# Patient Record
Sex: Male | Born: 1990 | Race: White | Hispanic: No
Health system: Southern US, Community
[De-identification: ages and names within clinical notes are randomized; demographics above are authoritative.]

## PROBLEM LIST (undated history)

## (undated) DIAGNOSIS — E079 Disorder of thyroid, unspecified: Secondary | ICD-10-CM

## (undated) DIAGNOSIS — F419 Anxiety disorder, unspecified: Secondary | ICD-10-CM

## (undated) DIAGNOSIS — R519 Headache, unspecified: Secondary | ICD-10-CM

## (undated) DIAGNOSIS — Z9889 Other specified postprocedural states: Secondary | ICD-10-CM

## (undated) DIAGNOSIS — E785 Hyperlipidemia, unspecified: Secondary | ICD-10-CM

## (undated) DIAGNOSIS — F4310 Post-traumatic stress disorder, unspecified: Secondary | ICD-10-CM

## (undated) DIAGNOSIS — F32A Depression, unspecified: Secondary | ICD-10-CM

## (undated) DIAGNOSIS — R112 Nausea with vomiting, unspecified: Secondary | ICD-10-CM

## (undated) HISTORY — PX: MASTECTOMY: SHX3

## (undated) HISTORY — PX: ABDOMINAL HYSTERECTOMY: SHX81

---

## 2018-09-09 DIAGNOSIS — Z87890 Personal history of sex reassignment: Secondary | ICD-10-CM | POA: Insufficient documentation

## 2020-08-22 ENCOUNTER — Ambulatory Visit (HOSPITAL_COMMUNITY)
Admission: EM | Admit: 2020-08-22 | Discharge: 2020-08-22 | Disposition: A | Discharge status: Home / Self Care | Payer: No Typology Code available for payment source

## 2020-08-22 ENCOUNTER — Encounter (HOSPITAL_COMMUNITY): Payer: Self-pay

## 2020-08-22 ENCOUNTER — Ambulatory Visit (INDEPENDENT_AMBULATORY_CARE_PROVIDER_SITE_OTHER): Payer: No Typology Code available for payment source

## 2020-08-22 ENCOUNTER — Other Ambulatory Visit: Payer: Self-pay

## 2020-08-22 DIAGNOSIS — Y990 Civilian activity done for income or pay: Secondary | ICD-10-CM

## 2020-08-22 DIAGNOSIS — M79642 Pain in left hand: Secondary | ICD-10-CM | POA: Diagnosis not present

## 2020-08-22 DIAGNOSIS — S6992XA Unspecified injury of left wrist, hand and finger(s), initial encounter: Secondary | ICD-10-CM

## 2020-08-22 DIAGNOSIS — X58XXXA Exposure to other specified factors, initial encounter: Secondary | ICD-10-CM | POA: Diagnosis not present

## 2020-08-22 HISTORY — DX: Anxiety disorder, unspecified: F41.9

## 2020-08-22 HISTORY — DX: Disorder of thyroid, unspecified: E07.9

## 2020-08-22 HISTORY — DX: Post-traumatic stress disorder, unspecified: F43.10

## 2020-08-22 HISTORY — DX: Depression, unspecified: F32.A

## 2020-08-22 MED ORDER — NAPROXEN 500 MG PO TABS: 500.0000 mg | ORAL_TABLET | Freq: Two times a day (BID) | ORAL | 0 refills | Status: DC

## 2020-08-22 NOTE — ED Triage Notes (Signed)
Pt presents with left hand pain after smashing it between a prop and door frame at work this evening.

## 2020-08-22 NOTE — ED Provider Notes (Signed)
[facility]    CSN: [id number] Arrival date & time: 08/22/20  1745      History   Chief Complaint Chief Complaint  Patient presents with  . Hand Injury    HPI Gary Calderon is a 30 y.o. male.   HPI   Hand Pain: Patient reports that he smashed his left hand in a door frame accidentally this evening at work.  He notes pain of the hand itself as well as of the second through fourth fingers.  He states that he has pain with flexion and extension of the third and fourth fingers of the side.  He also has a area of ecchymosis throughout the hand from this encounter.  He has tried ibuprofen which has not given much relief to symptoms.  He denies any previous injury to this area.   Past Medical History:  Diagnosis Date  . Anxiety   . Depression   . PTSD (post-traumatic stress disorder)   . Thyroid disease     There are no problems to display for this patient.   Past Surgical History:  Procedure Laterality Date  . ABDOMINAL HYSTERECTOMY    . MASTECTOMY         Home Medications    Prior to Admission medications   Medication Sig Start Date End Date Taking? Authorizing Provider  busPIRone (BUSPAR) 10 MG tablet Take 10 mg by mouth 3 (three) times daily.   Yes [provider]    Family History Family History  Family history unknown: Yes    Social History Social History   Tobacco Use  . Smoking status: Never Smoker     Allergies   Caffeine and Mustard seed   Review of Systems Review of Systems  As stated above in HPI Physical Exam Triage Vital Signs ED Triage Vitals  Enc Vitals Group     BP 08/22/20 1847 131/84     Pulse Rate 08/22/20 1847 90     Resp 08/22/20 1847 20     Temp 08/22/20 1847 98 F (36.7 C)     Temp Source 08/22/20 1847 Oral     SpO2 08/22/20 1847 98 %     Weight --      Height --      Head Circumference --      Peak Flow --      Pain Score 08/22/20 1849 7     Pain Loc --      Pain Edu? --      Excl. in GC?  --    No data found.  Updated Vital Signs BP 131/84 (BP Location: Right Arm)   Pulse 90   Temp 98 F (36.7 C) (Oral)   Resp 20   SpO2 98%   Physical Exam Vitals and nursing note reviewed.  Constitutional:      Appearance: Normal appearance.  Musculoskeletal:     Comments: Positive fracture testing to the left hand.  There is some tenderness to palpation of the proximal fourth finger, the MCP joint of the third finger and the proximal aspect of the second digit. Patient is unable to fully extend fingers 3 and 4 of the left hand.  Skin:    Capillary Refill: Capillary refill takes less than 2 seconds.     Findings: Bruising (linear erythematic early ecchymosis of the left anterior hand) present.  Neurological:     Mental Status: He is alert and oriented to person, place, and time.     Sensory: No sensory deficit.  Motor: No weakness.      UC Treatments / Results  Labs (all labs ordered are listed, but only abnormal results are displayed) Labs Reviewed - No data to display  EKG   Radiology No results found.  Procedures Procedures (including critical care time)  Medications Ordered in UC Medications - No data to display  Initial Impression / Assessment and Plan / UC Course  I have reviewed the triage vital signs and the nursing notes.  Pertinent labs & imaging results that were available during my care of the patient were reviewed by me and considered in my medical decision making (see chart for details).     New.  X-rays pending. Update.  X-ray is normal.  Likely soft tissue swelling and pain.  Will prescribe naproxen along with RICE.  Given the nature of the injury I would not recommend a splint or brace of the hand to avoid any tendon aggravation. Discussed red flag signs and symptoms.  Final Clinical Impressions(s) / UC Diagnoses   Final diagnoses:  None   Discharge Instructions   None    ED Prescriptions    None     PDMP not reviewed this  encounter.   Rushie Chestnut, [place] 08/22/20 1955

## 2021-03-01 ENCOUNTER — Emergency Department (HOSPITAL_COMMUNITY)
Admission: EM | Admit: 2021-03-01 | Discharge: 2021-03-02 | Disposition: A | Discharge status: Home / Self Care | Payer: 59 | Attending: Emergency Medicine | Admitting: Emergency Medicine

## 2021-03-01 ENCOUNTER — Encounter (HOSPITAL_COMMUNITY): Payer: Self-pay | Admitting: *Deleted

## 2021-03-01 ENCOUNTER — Other Ambulatory Visit: Payer: Self-pay

## 2021-03-01 DIAGNOSIS — U071 COVID-19: Secondary | ICD-10-CM | POA: Diagnosis not present

## 2021-03-01 DIAGNOSIS — R059 Cough, unspecified: Secondary | ICD-10-CM | POA: Diagnosis present

## 2021-03-01 NOTE — ED Provider Notes (Signed)
Emergency Medicine Provider Triage Evaluation Note  Gary Calderon , a 30 y.o. male  was evaluated in triage.  Pt complains of cough, fever, and feeling unwell for about 2 days now.  States he needs covid test.  Denies covid exposures, he was vaccinated.  Review of Systems  Positive: Fever, cough, sore throat Negative: Vomitign, diarrhea  Physical Exam  BP (!) 120/104 (BP Location: Right Arm)   Pulse (!) 103   Temp 99.7 F (37.6 C) (Oral)   Resp 18   SpO2 99%  Gen:   Awake, no distress   Resp:  Normal effort  MSK:   Moves extremities without difficulty  Other:  No oropharyngeal edema, no stridor, normal phonation  Medical Decision Making  Medically screening exam initiated at 10:19 PM.  Appropriate orders placed.  [name] was informed that the remainder of the evaluation will be completed by another provider, this initial triage assessment does not replace that evaluation, and the importance of remaining in the ED until their evaluation is complete.  Cough, fever, sore throat.  Requested covid test-- sent.   [name], PA-C 03/01/21 2221    [name], MD 03/05/21 [phone]

## 2021-03-01 NOTE — ED Triage Notes (Addendum)
Pt requesting a covid swab due to possible exposure and mild symptoms including cough and sore throat. No distress noted at triage.

## 2021-03-02 LAB — RESP PANEL BY RT-PCR (FLU A&B, COVID) ARPGX2
Influenza A by PCR: NEGATIVE
Influenza B by PCR: NEGATIVE
SARS Coronavirus 2 by RT PCR: POSITIVE — AB

## 2021-03-02 LAB — CBG MONITORING, ED: Glucose-Capillary: 117 mg/dL — ABNORMAL HIGH (ref 70–99)

## 2021-03-02 MED ORDER — ONDANSETRON 4 MG PO TBDP: 4.0000 mg | ORAL_TABLET | Freq: Once | ORAL | Status: DC

## 2021-03-02 NOTE — ED Notes (Signed)
Pt did not answer to roll call

## 2021-03-02 NOTE — ED Provider Notes (Addendum)
[facility] EMERGENCY DEPARTMENT Provider Note   CSN: [id number] Arrival date & time: 03/01/21  2118     History Chief Complaint  Patient presents with   Cough    Gary Calderon is a 30 y.o. male.  The history is provided by the patient and medical records.  Cough   30 y.o. M with hx anxiety, depression, PTSD, here with cough, fever, and feeling unwell for about 2 days now.  States he needs covid test.  Denies covid exposures, he was vaccinated.  He is currently in school.  Past Medical History:  Diagnosis Date   Anxiety    Depression    PTSD (post-traumatic stress disorder)    Thyroid disease     There are no problems to display for this patient.   Past Surgical History:  Procedure Laterality Date   ABDOMINAL HYSTERECTOMY     MASTECTOMY         Family History  Family history unknown: Yes    Social History   Tobacco Use   Smoking status: Never    Home Medications Prior to Admission medications   Medication Sig Start Date End Date Taking? Authorizing Provider  busPIRone (BUSPAR) 10 MG tablet Take 10 mg by mouth 3 (three) times daily.    [provider]  naproxen (NAPROSYN) 500 MG tablet Take 1 tablet (500 mg total) by mouth 2 (two) times daily. 08/22/20   Rushie Chestnut, PA-C    Allergies    Caffeine and Mustard seed  Review of Systems   Review of Systems  HENT:  Positive for congestion.   Respiratory:  Positive for cough.   All other systems reviewed and are negative.  Physical Exam Updated Vital Signs BP (!) 120/104 (BP Location: Right Arm)   Pulse (!) 103   Temp 99.7 F (37.6 C) (Oral)   Resp 18   SpO2 99%   Physical Exam Vitals and nursing note reviewed.  Constitutional:      Appearance: He is well-developed.  HENT:     Head: Normocephalic and atraumatic.     Nose: Congestion present.  Eyes:     Conjunctiva/sclera: Conjunctivae normal.     Pupils: Pupils are equal, round, and reactive to light.   Cardiovascular:     Rate and Rhythm: Normal rate and regular rhythm.     Heart sounds: Normal heart sounds.  Pulmonary:     Effort: Pulmonary effort is normal.     Breath sounds: Normal breath sounds.  Abdominal:     General: Bowel sounds are normal.     Palpations: Abdomen is soft.  Musculoskeletal:        General: Normal range of motion.     Cervical back: Normal range of motion.  Skin:    General: Skin is warm and dry.  Neurological:     Mental Status: He is alert and oriented to person, place, and time.    ED Results / Procedures / Treatments   Labs (all labs ordered are listed, but only abnormal results are displayed) Labs Reviewed  RESP PANEL BY RT-PCR (FLU A&B, COVID) ARPGX2 - Abnormal; Notable for the following components:      Result Value   SARS Coronavirus 2 by RT PCR POSITIVE (*)    All other components within normal limits    EKG None  Radiology No results found.  Procedures Procedures   Medications Ordered in ED Medications - No data to display  ED Course  I have reviewed the  triage vital signs and the nursing notes.  Pertinent labs & imaging results that were available during my care of the patient were reviewed by me and considered in my medical decision making (see chart for details).    MDM Rules/Calculators/A&P                           30 year old male presenting to the ED with URI type symptoms over the past 2 days.  Mostly nasal congestion cough, sore throat.  Covid screen positive.  He was instructed to quarantine for at least 5 days, given copy of his positive result along with note for school.  Encouraged to rest and hydrate.  Can follow-up with PCP.  Return here for new concerns.  2:22 AM  At time of discharge patient became extremely pale, diaphoretic and nauseated.  He has been sleeping in the lobby for the past few hours but has not eaten in over 12 hours.  CBG 117.  BP softer than prior but stable.  Appeared to have vasovagal  response.  Currently eating/drinking, color is improving.  Will monitor for a bit here.  3:06 AM Patient monitored, now back to baseline.  He is ambulatory without issue.  Feel he is stable for discharge home.    Final Clinical Impression(s) / ED Diagnoses Final diagnoses:  COVID-19    Rx / DC Orders ED Discharge Orders     None        [name], PA-C 03/02/21 0215    [name], PA-C 03/02/21 8299    Geoffery Lyons, MD 03/02/21 [phone]

## 2021-03-02 NOTE — Discharge Instructions (Signed)
Covid test is positive. Make sure to rest and stay hydrated.  Will likely need to quarantine for at least 5 days unless school clears you sooner.

## 2021-05-09 ENCOUNTER — Encounter: Payer: Self-pay | Admitting: Endocrinology

## 2021-05-24 ENCOUNTER — Emergency Department (HOSPITAL_COMMUNITY)
Admission: EM | Admit: 2021-05-24 | Discharge: 2021-05-24 | Disposition: A | Discharge status: Home / Self Care | Payer: No Typology Code available for payment source | Attending: Emergency Medicine | Admitting: Emergency Medicine

## 2021-05-24 ENCOUNTER — Emergency Department (HOSPITAL_COMMUNITY): Payer: No Typology Code available for payment source

## 2021-05-24 ENCOUNTER — Encounter (HOSPITAL_COMMUNITY): Payer: Self-pay | Admitting: Radiology

## 2021-05-24 DIAGNOSIS — Z20822 Contact with and (suspected) exposure to covid-19: Secondary | ICD-10-CM | POA: Insufficient documentation

## 2021-05-24 DIAGNOSIS — S0081XA Abrasion of other part of head, initial encounter: Secondary | ICD-10-CM | POA: Diagnosis not present

## 2021-05-24 DIAGNOSIS — T07XXXA Unspecified multiple injuries, initial encounter: Secondary | ICD-10-CM

## 2021-05-24 DIAGNOSIS — S62232A Other displaced fracture of base of first metacarpal bone, left hand, initial encounter for closed fracture: Secondary | ICD-10-CM

## 2021-05-24 DIAGNOSIS — Y92410 Unspecified street and highway as the place of occurrence of the external cause: Secondary | ICD-10-CM | POA: Insufficient documentation

## 2021-05-24 DIAGNOSIS — S93401A Sprain of unspecified ligament of right ankle, initial encounter: Secondary | ICD-10-CM | POA: Diagnosis not present

## 2021-05-24 DIAGNOSIS — T1490XA Injury, unspecified, initial encounter: Secondary | ICD-10-CM

## 2021-05-24 DIAGNOSIS — S6992XA Unspecified injury of left wrist, hand and finger(s), initial encounter: Secondary | ICD-10-CM | POA: Diagnosis present

## 2021-05-24 LAB — CBG MONITORING, ED: Glucose-Capillary: 106 mg/dL — ABNORMAL HIGH (ref 70–99)

## 2021-05-24 LAB — ETHANOL: Alcohol, Ethyl (B): <10 mg/dL (ref <10)

## 2021-05-24 LAB — LACTIC ACID, PLASMA: Lactic Acid, Venous: 2.3 mmol/L (ref 0.5–1.9)

## 2021-05-24 LAB — RESP PANEL BY RT-PCR (FLU A&B, COVID) ARPGX2
Influenza A by PCR: NEGATIVE
Influenza B by PCR: NEGATIVE
SARS Coronavirus 2 by RT PCR: NEGATIVE

## 2021-05-24 LAB — COMPREHENSIVE METABOLIC PANEL
ALT: 21 U/L (ref 0–44)
AST: 31 U/L (ref 15–41)
Albumin: 3.8 g/dL (ref 3.5–5.0)
Alkaline Phosphatase: 105 U/L (ref 38–126)
Anion gap: 10 (ref 5–15)
BUN: 18 mg/dL (ref 6–20)
CO2: 21 mmol/L — ABNORMAL LOW (ref 22–32)
Calcium: 8.9 mg/dL (ref 8.9–10.3)
Chloride: 110 mmol/L (ref 98–111)
Creatinine, Ser: 1.11 mg/dL (ref 0.61–1.24)
GFR, Estimated: >60 mL/min (ref >60)
Glucose, Bld: 112 mg/dL — ABNORMAL HIGH (ref 70–99)
Potassium: 3.9 mmol/L (ref 3.5–5.1)
Sodium: 141 mmol/L (ref 135–145)
Total Bilirubin: 0.7 mg/dL (ref 0.3–1.2)
Total Protein: 6 g/dL — ABNORMAL LOW (ref 6.5–8.1)

## 2021-05-24 LAB — CBC
HCT: 45.4 % (ref 39.0–52.0)
Hemoglobin: 15 g/dL (ref 13.0–17.0)
MCH: 30.7 pg (ref 26.0–34.0)
MCHC: 33 g/dL (ref 30.0–36.0)
MCV: 92.8 fL (ref 80.0–100.0)
Platelets: 255 10*3/uL (ref 150–400)
RBC: 4.89 MIL/uL (ref 4.22–5.81)
RDW: 12 % (ref 11.5–15.5)
WBC: 8.4 10*3/uL (ref 4.0–10.5)
nRBC: 0 % (ref 0.0–0.2)

## 2021-05-24 LAB — I-STAT CHEM 8, ED
BUN: 22 mg/dL — ABNORMAL HIGH (ref 6–20)
Calcium, Ion: 1.15 mmol/L (ref 1.15–1.40)
Chloride: 110 mmol/L (ref 98–111)
Creatinine, Ser: 1 mg/dL (ref 0.61–1.24)
Glucose, Bld: 109 mg/dL — ABNORMAL HIGH (ref 70–99)
HCT: 44 % (ref 39.0–52.0)
Hemoglobin: 15 g/dL (ref 13.0–17.0)
Potassium: 3.8 mmol/L (ref 3.5–5.1)
Sodium: 142 mmol/L (ref 135–145)
TCO2: 23 mmol/L (ref 22–32)

## 2021-05-24 LAB — PROTIME-INR
INR: 1 (ref 0.8–1.2)
Prothrombin Time: 13.2 seconds (ref 11.4–15.2)

## 2021-05-24 LAB — SAMPLE TO BLOOD BANK

## 2021-05-24 MED ORDER — IOHEXOL 350 MG/ML SOLN
100.0000 mL | Freq: Once | INTRAVENOUS | Status: AC | PRN
  Administered 2021-05-24: 100 mL via INTRAVENOUS

## 2021-05-24 MED ORDER — LACTATED RINGERS IV BOLUS
1000.0000 mL | Freq: Once | INTRAVENOUS | Status: AC
  Administered 2021-05-24: 1000 mL via INTRAVENOUS

## 2021-05-24 NOTE — ED Notes (Signed)
ED MD updated on hypotension, 1L NS bolus initiated

## 2021-05-24 NOTE — ED Notes (Signed)
Dr Fredderick Phenix says okay to eat, gave graham crackers. Watching BP closely. Pt reports slight dizziness, "woozy" sensation
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
## 2021-05-24 NOTE — ED Notes (Signed)
Pt ambulated to BR and back. Denies dizziness, endorses ankle pain. VSS. Updated Dr. Tamera Punt, likely able to d/c.

## 2021-05-24 NOTE — ED Notes (Signed)
OK to eat and drink per Dr. Fredderick Phenix

## 2021-05-24 NOTE — ED Notes (Signed)
[facility] cleared by Dr. Janee Morn
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
## 2021-05-24 NOTE — ED Notes (Signed)
Pt transported to CT with TRN present to monitor. RN remained with the patient throughout their absence from the ED.    Pt hypotensive BP 88/46 prior to CT.  NS bolus initiated.  SBP consistently low. TRN spoke with Dr. Tamera Punt about this.  Dr. Tamera Punt notified Dr. Grandville Silos and trauma MD came to CT to look at scans for potential reasoning for hypotension.  Will stay with pt and continue to monitor.  - Pt asymptomatic upon assessment and is mostly complaining of 5 out of 10 pain to L wrist. - Will do bedside XR upon return to trauma A.

## 2021-05-24 NOTE — ED Notes (Signed)
Pt to CT
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
## 2021-05-24 NOTE — ED Notes (Signed)
Short arm splint in place to L arm/hand

## 2021-05-24 NOTE — ED Notes (Addendum)
Pt returns from CT. Was hypotensive in CT, has additional 1L NS bolus infusing at this time, total 2L hanging at this time. Dr Janee Morn was in CT at that time and is aware.

## 2021-05-24 NOTE — Progress Notes (Signed)
Orthopedic Tech Progress Note Patient Details:  Gary Calderon Dec 20, 1990 [id number]   Ortho Devices Type of Ortho Device: ASO, Thumb spica splint Splint Material: Fiberglass Ortho Device/Splint Location: LUE and RLE Ortho Device/Splint Interventions: Ordered, Application, Adjustment   Post Interventions Patient Tolerated: Well Instructions Provided: Care of device, Poper ambulation with device, Adjustment of device  Brecklyn Galvis Carmine Savoy 05/24/2021, 6:07 PM

## 2021-05-24 NOTE — ED Notes (Signed)
Pt verbalized understanding of d/c instructions, meds and followup care. Denies questions. VSS, no distress noted. Steady gait to exit with all belongings.  ?

## 2021-05-24 NOTE — ED Notes (Signed)
Pt remains hypotensive but denies symptoms, 3rd liter LR hanging at this time.
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
## 2021-05-24 NOTE — ED Notes (Signed)
Pt sitting up in stretcher, BP improved, denies needs currently, call light in reach

## 2021-05-24 NOTE — ED Notes (Signed)
BP improving on return to room and boluses infusing, Katy TRN at bedside.
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
## 2021-05-24 NOTE — Discharge Instructions (Signed)
Follow-up with a hand surgeon as discussed.  Return here as needed if you have any worsening symptoms.

## 2021-05-24 NOTE — ED Notes (Signed)
CBG 106 
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
## 2021-05-25 ENCOUNTER — Encounter (HOSPITAL_COMMUNITY): Payer: Self-pay | Admitting: *Deleted

## 2021-05-28 ENCOUNTER — Encounter (HOSPITAL_BASED_OUTPATIENT_CLINIC_OR_DEPARTMENT_OTHER): Payer: Self-pay

## 2021-05-28 ENCOUNTER — Emergency Department (HOSPITAL_BASED_OUTPATIENT_CLINIC_OR_DEPARTMENT_OTHER)
Admission: EM | Admit: 2021-05-28 | Discharge: 2021-05-29 | Disposition: A | Discharge status: Home / Self Care | Payer: No Typology Code available for payment source | Attending: Emergency Medicine | Admitting: Emergency Medicine

## 2021-05-28 ENCOUNTER — Other Ambulatory Visit: Payer: Self-pay

## 2021-05-28 DIAGNOSIS — Y92410 Unspecified street and highway as the place of occurrence of the external cause: Secondary | ICD-10-CM | POA: Diagnosis not present

## 2021-05-28 DIAGNOSIS — L03115 Cellulitis of right lower limb: Secondary | ICD-10-CM | POA: Insufficient documentation

## 2021-05-28 DIAGNOSIS — S8991XA Unspecified injury of right lower leg, initial encounter: Secondary | ICD-10-CM | POA: Diagnosis present

## 2021-05-28 DIAGNOSIS — S90811D Abrasion, right foot, subsequent encounter: Secondary | ICD-10-CM | POA: Insufficient documentation

## 2021-05-28 DIAGNOSIS — S82831A Other fracture of upper and lower end of right fibula, initial encounter for closed fracture: Secondary | ICD-10-CM | POA: Diagnosis not present
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
## 2021-05-29 ENCOUNTER — Emergency Department (HOSPITAL_BASED_OUTPATIENT_CLINIC_OR_DEPARTMENT_OTHER): Payer: No Typology Code available for payment source

## 2021-05-29 ENCOUNTER — Emergency Department (HOSPITAL_BASED_OUTPATIENT_CLINIC_OR_DEPARTMENT_OTHER): Payer: No Typology Code available for payment source | Admitting: Radiology

## 2021-05-29 MED ORDER — CEPHALEXIN 500 MG PO CAPS: 500.0000 mg | ORAL_CAPSULE | Freq: Three times a day (TID) | ORAL | 0 refills | Status: DC

## 2021-05-29 MED ORDER — CEPHALEXIN 250 MG PO CAPS
500.0000 mg | ORAL_CAPSULE | Freq: Once | ORAL | Status: AC
  Administered 2021-05-29: 500 mg via ORAL
  Filled 2021-05-29: qty 2
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
## 2021-05-29 NOTE — Discharge Instructions (Addendum)
Please wear the knee immobilizer at all times until the orthopedic doctor tells you not to use it.  Apply warm compresses to the scraped area on your foot.  Return if the redness in your foot seems to be spreading, or if you start running a fever.

## 2021-05-30 ENCOUNTER — Ambulatory Visit: Payer: Medicaid Other | Admitting: Endocrinology

## 2021-06-11 ENCOUNTER — Encounter: Payer: Self-pay | Admitting: Endocrinology

## 2021-06-12 ENCOUNTER — Encounter (HOSPITAL_COMMUNITY): Payer: Self-pay | Admitting: Anesthesiology

## 2021-06-12 ENCOUNTER — Other Ambulatory Visit: Payer: Self-pay

## 2021-06-12 ENCOUNTER — Encounter (HOSPITAL_COMMUNITY): Payer: Self-pay | Admitting: Orthopedic Surgery

## 2021-06-12 NOTE — Anesthesia Preprocedure Evaluation (Deleted)
Anesthesia Evaluation    Reviewed: Allergy & Precautions, Patient's Chart, lab work & pertinent test results  Airway        Dental   Pulmonary  COVID + 03/01/21          Cardiovascular negative cardio ROS       Neuro/Psych PSYCHIATRIC DISORDERS Anxiety Depression negative neurological ROS     GI/Hepatic negative GI ROS, (+)       marijuana use,   Endo/Other  Morbid obesityBMI 40  Renal/GU   negative genitourinary   Musculoskeletal L thumb metacarpal fx   Abdominal   Peds  Hematology negative hematology ROS (+)   Anesthesia Other Findings   Reproductive/Obstetrics negative OB ROS                            Anesthesia Physical Anesthesia Plan  ASA: 2  Anesthesia Plan: MAC and Regional   Post-op Pain Management: Regional block and Tylenol PO (pre-op)   Induction:   PONV Risk Score and Plan: 2 and Propofol infusion and TIVA  Airway Management Planned: Natural Airway and Simple Face Mask  Additional Equipment: None  Intra-op Plan:   Post-operative Plan:   Informed Consent:   Plan Discussed with:   Anesthesia Plan Comments:         Anesthesia Quick Evaluation

## 2021-06-12 NOTE — Progress Notes (Addendum)
Mr. Gary Calderon denies chest pain or shortness of breath. Patient denies having any s/s of Covid in his household.  Patient denies any known exposure to Covid.   PCP is Ardell Isaacs with [facility].  Mr. Gary Calderon is getting with roommates to see if one can drive him home and stay with him for the 1st 24 hours after surgery.  I have called [facility] to see if they can pick patient up in am,  I 'm waiting on a return call.  I instructed patient to hold vitamins.  I instructed Mr. Freeloveto shower with antibiotic soap, if it is available.  Dry off with a clean towel. Do not put lotion, powder, cologne or deodorant or makeup.No jewelry or piercings. Men may shave their face and neck. Woman should not shave. No nail polish, artificial or acrylic nails. Wear clean clothes, brush your teeth. Glasses, contact lens,dentures or partials may not be worn in the OR. If you need to wear them, please bring a case for glasses, do not wear contacts or bring a case, the hospital does not have contact cases, dentures or partials will have to be removed , make sure they are clean, we will provide a denture cup to put them in. You will need some one to drive you home and a responsible person over the age of 73 to stay with you for the first 24 hours after surgery.

## 2021-06-13 ENCOUNTER — Other Ambulatory Visit: Payer: Self-pay

## 2021-06-13 ENCOUNTER — Encounter (HOSPITAL_COMMUNITY): Payer: Self-pay | Admitting: Orthopedic Surgery

## 2021-06-13 ENCOUNTER — Ambulatory Visit (HOSPITAL_COMMUNITY): Admission: RE | Admit: 2021-06-13 | Payer: Medicaid Other | Source: Home / Self Care | Admitting: Orthopedic Surgery

## 2021-06-13 HISTORY — DX: Other specified postprocedural states: Z98.890

## 2021-06-13 HISTORY — DX: Headache, unspecified: R51.9

## 2021-06-13 HISTORY — DX: Nausea with vomiting, unspecified: R11.2

## 2021-06-13 SURGERY — CLOSED REDUCTION, FRACTURE, METACARPAL BONE, WITH PERCUTANEOUS PINNING
Anesthesia: Regional | Site: Finger | Laterality: Left

## 2021-06-13 NOTE — Progress Notes (Signed)
[name], RN completed Pre-OP call.  Surgery date/time was changed.  Patient informed of new surgery date (06/15/21) and time change ([id number]).  Patient to arrive at 11:45 am on Friday, 06/15/21..  DUE TO COVID-19 ONLY ONE VISITOR IS ALLOWED TO COME WITH YOU AND STAY IN THE WAITING ROOM ONLY DURING PRE OP AND PROCEDURE DAY OF SURGERY.   PCP - Verdene Lennert, FNP Cardiologist - n/a  Chest x-ray - 05/24/21 (1V) EKG - 05/24/21 Stress Test - n/a ECHO - n/a Cardiac Cath - n/a  ICD Pacemaker/Loop - n/a  Sleep Study -  n/a CPAP - none  ERAS: Clear liquids til 11:15 AM DOS.  STOP now taking any Aspirin (unless otherwise instructed by your surgeon), Aleve, Naproxen, Ibuprofen, Motrin, Advil, Goody's, BC's, all herbal medications, fish oil, and all vitamins.   Coronavirus Screening Covid test n/a Ambulatory Surgery  Do you have any of the following symptoms:  Cough yes/no: No Fever (>100.35F)  yes/no: No Runny nose yes/no: No Sore throat yes/no: No Difficulty breathing/shortness of breath  yes/no: No  Have you traveled in the last 14 days and where? yes/no: No  Patient verbalized understanding of updated surgical instructions that were given via phone.

## 2021-06-14 ENCOUNTER — Other Ambulatory Visit: Payer: Self-pay | Admitting: Orthopedic Surgery

## 2021-06-14 NOTE — Progress Notes (Signed)
Gary Calderon called to ask if I could arrange [facility] to bring him to the hospital tomorrow. Patient states he has transporation home and someone to stay with him for the first 24 hours after surgery.  I left a voice message for [employer].

## 2021-06-15 ENCOUNTER — Other Ambulatory Visit: Payer: Self-pay

## 2021-06-15 ENCOUNTER — Observation Stay (HOSPITAL_COMMUNITY)
Admission: RE | Admit: 2021-06-15 | Discharge: 2021-06-16 | Disposition: A | Discharge status: Home / Self Care | Payer: Self-pay | Attending: Orthopedic Surgery | Admitting: Orthopedic Surgery

## 2021-06-15 ENCOUNTER — Ambulatory Visit (HOSPITAL_COMMUNITY): Payer: Self-pay | Admitting: Anesthesiology

## 2021-06-15 ENCOUNTER — Encounter (HOSPITAL_COMMUNITY)
Admission: RE | Disposition: A | Discharge status: Home / Self Care | Payer: Self-pay | Source: Home / Self Care | Attending: Orthopedic Surgery

## 2021-06-15 ENCOUNTER — Encounter (HOSPITAL_COMMUNITY): Payer: Self-pay | Admitting: Orthopedic Surgery

## 2021-06-15 DIAGNOSIS — X58XXXA Exposure to other specified factors, initial encounter: Secondary | ICD-10-CM | POA: Insufficient documentation

## 2021-06-15 DIAGNOSIS — S62232A Other displaced fracture of base of first metacarpal bone, left hand, initial encounter for closed fracture: Principal | ICD-10-CM | POA: Insufficient documentation

## 2021-06-15 DIAGNOSIS — Z9010 Acquired absence of unspecified breast and nipple: Secondary | ICD-10-CM | POA: Insufficient documentation

## 2021-06-15 DIAGNOSIS — Z8781 Personal history of (healed) traumatic fracture: Secondary | ICD-10-CM

## 2021-06-15 HISTORY — DX: Hyperlipidemia, unspecified: E78.5

## 2021-06-15 HISTORY — PX: CLOSED REDUCTION METACARPAL WITH PERCUTANEOUS PINNING: SHX5613

## 2021-06-15 SURGERY — CLOSED REDUCTION, FRACTURE, METACARPAL BONE, WITH PERCUTANEOUS PINNING
Anesthesia: Regional | Site: Finger | Laterality: Left

## 2021-06-15 MED ORDER — ONDANSETRON HCL 4 MG/2ML IJ SOLN
INTRAMUSCULAR | Status: DC | PRN
  Administered 2021-06-15: 4 mg via INTRAVENOUS

## 2021-06-15 MED ORDER — ACETAMINOPHEN 160 MG/5ML PO SOLN: 325.0000 mg | ORAL | Status: DC | PRN

## 2021-06-15 MED ORDER — FENTANYL CITRATE (PF) 100 MCG/2ML IJ SOLN: 25.0000 ug | INTRAMUSCULAR | Status: DC | PRN

## 2021-06-15 MED ORDER — CHLORHEXIDINE GLUCONATE 0.12 % MT SOLN: 15.0000 mL | Freq: Once | OROMUCOSAL | Status: AC

## 2021-06-15 MED ORDER — FENTANYL CITRATE (PF) 100 MCG/2ML IJ SOLN
INTRAMUSCULAR | Status: AC
  Filled 2021-06-15: qty 2

## 2021-06-15 MED ORDER — ACETAMINOPHEN 500 MG PO TABS: 1000.0000 mg | ORAL_TABLET | Freq: Once | ORAL | Status: AC

## 2021-06-15 MED ORDER — CEFAZOLIN IN SODIUM CHLORIDE 3-0.9 GM/100ML-% IV SOLN
INTRAVENOUS | Status: AC
  Filled 2021-06-15: qty 100

## 2021-06-15 MED ORDER — ACETAMINOPHEN 325 MG PO TABS
ORAL_TABLET | ORAL | Status: AC
  Filled 2021-06-15: qty 2

## 2021-06-15 MED ORDER — FENTANYL CITRATE (PF) 100 MCG/2ML IJ SOLN: 100.0000 ug | Freq: Once | INTRAMUSCULAR | Status: AC

## 2021-06-15 MED ORDER — MIDAZOLAM HCL 2 MG/2ML IJ SOLN
INTRAMUSCULAR | Status: AC
  Administered 2021-06-15: 2 mg via INTRAVENOUS
  Filled 2021-06-15: qty 2

## 2021-06-15 MED ORDER — FENTANYL CITRATE (PF) 100 MCG/2ML IJ SOLN
100.0000 ug | Freq: Once | INTRAMUSCULAR | Status: AC
  Administered 2021-06-15: 100 ug via INTRAVENOUS

## 2021-06-15 MED ORDER — PROPOFOL 500 MG/50ML IV EMUL
INTRAVENOUS | Status: DC | PRN
  Administered 2021-06-15: 150 ug/kg/min via INTRAVENOUS

## 2021-06-15 MED ORDER — OXYCODONE HCL 5 MG PO TABS
ORAL_TABLET | ORAL | Status: AC
  Filled 2021-06-15: qty 1

## 2021-06-15 MED ORDER — OXYCODONE HCL 5 MG/5ML PO SOLN: 5.0000 mg | Freq: Once | ORAL | Status: AC | PRN

## 2021-06-15 MED ORDER — MIDAZOLAM HCL 2 MG/2ML IJ SOLN: 2.0000 mg | Freq: Once | INTRAMUSCULAR | Status: AC

## 2021-06-15 MED ORDER — OXYCODONE-ACETAMINOPHEN 5-325 MG PO TABS
1.0000 | ORAL_TABLET | ORAL | 0 refills | Status: DC | PRN
Start: 2021-06-15 — End: 2022-02-28

## 2021-06-15 MED ORDER — CEFAZOLIN IN SODIUM CHLORIDE 3-0.9 GM/100ML-% IV SOLN
3.0000 g | INTRAVENOUS | Status: AC
  Administered 2021-06-15: 3 g via INTRAVENOUS

## 2021-06-15 MED ORDER — FENTANYL CITRATE (PF) 100 MCG/2ML IJ SOLN
INTRAMUSCULAR | Status: AC
  Administered 2021-06-15: 100 ug via INTRAVENOUS
  Filled 2021-06-15: qty 2

## 2021-06-15 MED ORDER — ONDANSETRON HCL 4 MG/2ML IJ SOLN: 4.0000 mg | Freq: Once | INTRAMUSCULAR | Status: DC | PRN

## 2021-06-15 MED ORDER — ORAL CARE MOUTH RINSE: 15.0000 mL | Freq: Once | OROMUCOSAL | Status: AC

## 2021-06-15 MED ORDER — OXYCODONE HCL 5 MG PO TABS
5.0000 mg | ORAL_TABLET | Freq: Once | ORAL | Status: AC | PRN
  Administered 2021-06-15: 5 mg via ORAL

## 2021-06-15 MED ORDER — CHLORHEXIDINE GLUCONATE 0.12 % MT SOLN
OROMUCOSAL | Status: AC
  Administered 2021-06-15: 15 mL via OROMUCOSAL
  Filled 2021-06-15: qty 15

## 2021-06-15 MED ORDER — MEPERIDINE HCL 25 MG/ML IJ SOLN: 6.2500 mg | INTRAMUSCULAR | Status: DC | PRN

## 2021-06-15 MED ORDER — 0.9 % SODIUM CHLORIDE (POUR BTL) OPTIME
TOPICAL | Status: DC | PRN
  Administered 2021-06-15: 1000 mL

## 2021-06-15 MED ORDER — LIDOCAINE 2% (20 MG/ML) 5 ML SYRINGE
INTRAMUSCULAR | Status: DC | PRN
  Administered 2021-06-15: 60 mg via INTRAVENOUS

## 2021-06-15 MED ORDER — ACETAMINOPHEN 325 MG PO TABS
325.0000 mg | ORAL_TABLET | ORAL | Status: DC | PRN
  Administered 2021-06-15: 650 mg via ORAL

## 2021-06-15 MED ORDER — BUPIVACAINE LIPOSOME 1.3 % IJ SUSP
INTRAMUSCULAR | Status: DC | PRN
Start: 2021-06-15 — End: 2021-06-15
  Administered 2021-06-15: 10 mL via PERINEURAL

## 2021-06-15 MED ORDER — LACTATED RINGERS IV SOLN: INTRAVENOUS | Status: DC

## 2021-06-15 MED ORDER — BUPIVACAINE-EPINEPHRINE (PF) 0.5% -1:200000 IJ SOLN
INTRAMUSCULAR | Status: DC | PRN
  Administered 2021-06-15: 15 mL via PERINEURAL

## 2021-06-15 MED ORDER — MIDAZOLAM HCL 2 MG/2ML IJ SOLN
INTRAMUSCULAR | Status: AC
  Filled 2021-06-15: qty 2

## 2021-06-15 MED ORDER — ACETAMINOPHEN 500 MG PO TABS
ORAL_TABLET | ORAL | Status: AC
  Administered 2021-06-15: 1000 mg via ORAL
  Filled 2021-06-15: qty 2

## 2021-06-15 MED ORDER — MIDAZOLAM HCL 2 MG/2ML IJ SOLN
INTRAMUSCULAR | Status: DC | PRN
Start: 2021-06-15 — End: 2021-06-15
  Administered 2021-06-15: 2 mg via INTRAVENOUS

## 2021-06-15 SURGICAL SUPPLY — 35 items
BAG COUNTER SPONGE SURGICOUNT (BAG) ×2 IMPLANT
BAG SPNG CNTER NS LX DISP (BAG) ×1
BNDG CMPR 9X4 STRL LF SNTH (GAUZE/BANDAGES/DRESSINGS) ×1
BNDG ELASTIC 4X5.8 VLCR STR LF (GAUZE/BANDAGES/DRESSINGS) ×2 IMPLANT
BNDG ESMARK 4X9 LF (GAUZE/BANDAGES/DRESSINGS) ×1 IMPLANT
BNDG GAUZE ELAST 4 BULKY (GAUZE/BANDAGES/DRESSINGS) ×1 IMPLANT
CORD BIPOLAR FORCEPS 12FT (ELECTRODE) IMPLANT
CUFF TOURN SGL QUICK 18X4 (TOURNIQUET CUFF) ×2 IMPLANT
CUFF TOURN SGL QUICK 24 (TOURNIQUET CUFF)
CUFF TRNQT CYL 24X4X16.5-23 (TOURNIQUET CUFF) IMPLANT
DRAPE SURG 17X23 STRL (DRAPES) ×2 IMPLANT
GAUZE SPONGE 4X4 12PLY STRL (GAUZE/BANDAGES/DRESSINGS) ×1 IMPLANT
GAUZE XEROFORM 1X8 LF (GAUZE/BANDAGES/DRESSINGS) ×1 IMPLANT
GLOVE SURG SYN 8.0 (GLOVE) ×2 IMPLANT
GLOVE SURG SYN 8.0 PF PI (GLOVE) ×1 IMPLANT
GOWN STRL REUS W/ TWL LRG LVL3 (GOWN DISPOSABLE) ×1 IMPLANT
GOWN STRL REUS W/ TWL XL LVL3 (GOWN DISPOSABLE) ×1 IMPLANT
GOWN STRL REUS W/TWL LRG LVL3 (GOWN DISPOSABLE) ×2
GOWN STRL REUS W/TWL XL LVL3 (GOWN DISPOSABLE) ×2
K-WIRE DBL TROCAR .062X4 (WIRE) ×4
KIT BASIN OR (CUSTOM PROCEDURE TRAY) ×2 IMPLANT
KIT TURNOVER KIT B (KITS) ×2 IMPLANT
KWIRE DBL TROCAR .062X4 (WIRE) IMPLANT
NDL HYPO 25GX1X1/2 BEV (NEEDLE) IMPLANT
NEEDLE HYPO 25GX1X1/2 BEV (NEEDLE) IMPLANT
NS IRRIG 1000ML POUR BTL (IV SOLUTION) ×2 IMPLANT
PACK ORTHO EXTREMITY (CUSTOM PROCEDURE TRAY) ×2 IMPLANT
PAD ARMBOARD 7.5X6 YLW CONV (MISCELLANEOUS) ×4 IMPLANT
PAD CAST 4YDX4 CTTN HI CHSV (CAST SUPPLIES) IMPLANT
PADDING CAST COTTON 4X4 STRL (CAST SUPPLIES) ×2
PADDING CAST COTTON 6X4 STRL (CAST SUPPLIES) ×1 IMPLANT
SYR CONTROL 10ML LL (SYRINGE) ×2 IMPLANT
TOWEL GREEN STERILE (TOWEL DISPOSABLE) ×2 IMPLANT
TOWEL GREEN STERILE FF (TOWEL DISPOSABLE) ×2 IMPLANT
UNDERPAD 30X36 HEAVY ABSORB (UNDERPADS AND DIAPERS) ×2 IMPLANT

## 2021-06-15 NOTE — Anesthesia Procedure Notes (Signed)
Anesthesia Regional Block: Supraclavicular block   Pre-Anesthetic Checklist: , timeout performed,  Correct Patient, Correct Site, Correct Laterality,  Correct Procedure, Correct Position, site marked,  Risks and benefits discussed,  Surgical consent,  Pre-op evaluation,  At surgeon's request and post-op pain management  Laterality: Left  Prep: chloraprep       Needles:  Injection technique: Single-shot  Needle Type: Echogenic Stimulator Needle     Needle Length: 5cm  Needle Gauge: 22     Additional Needles:   Procedures:, nerve stimulator,,, ultrasound used (permanent image in chart),,     Nerve Stimulator or Paresthesia:  Response: quadraceps contraction, 0.45 mA  Additional Responses:   Narrative:  Start time: 06/15/2021 2:20 PM End time: 06/15/2021 2:25 PM Injection made incrementally with aspirations every 5 mL.  Performed by: Personally  Anesthesiologist: Bethena Midget, MD  Additional Notes: Functioning IV was confirmed and monitors were applied.  A 35mm 22ga Arrow echogenic stimulator needle was used. Sterile prep and drape,hand hygiene and sterile gloves were used. Ultrasound guidance: relevant anatomy identified, needle position confirmed, local anesthetic spread visualized around nerve(s)., vascular puncture avoided.  Image printed for medical record. Negative aspiration and negative test dose prior to incremental administration of local anesthetic. The patient tolerated the procedure well.

## 2021-06-15 NOTE — Anesthesia Preprocedure Evaluation (Addendum)
Anesthesia Evaluation  Patient identified by MRN, date of birth, ID band Patient awake    Reviewed: Allergy & Precautions, H&P , NPO status , Patient's Chart, lab work & pertinent test results, reviewed documented beta blocker date and time   History of Anesthesia Complications (+) PONV and history of anesthetic complications  Airway Mallampati: II  TM Distance: >3 FB Neck ROM: full    Dental no notable dental hx. (+) Teeth Intact, Dental Advisory Given   Pulmonary neg pulmonary ROS,    Pulmonary exam normal breath sounds clear to auscultation       Cardiovascular Exercise Tolerance: Good negative cardio ROS Normal cardiovascular exam Rhythm:regular Rate:Normal     Neuro/Psych  Headaches, PSYCHIATRIC DISORDERS Anxiety Depression    GI/Hepatic negative GI ROS, Neg liver ROS,   Endo/Other  negative endocrine ROS  Renal/GU negative Renal ROS  negative genitourinary   Musculoskeletal   Abdominal   Peds  Hematology negative hematology ROS (+)   Anesthesia Other Findings   Reproductive/Obstetrics negative OB ROS                            Anesthesia Physical Anesthesia Plan  ASA: 2  Anesthesia Plan: General and Regional   Post-op Pain Management: Regional block   Induction:   PONV Risk Score and Plan: 2 and Ondansetron and Dexamethasone  Airway Management Planned: Oral ETT and LMA  Additional Equipment: None  Intra-op Plan:   Post-operative Plan:   Informed Consent: I have reviewed the patients History and Physical, chart, labs and discussed the procedure including the risks, benefits and alternatives for the proposed anesthesia with the patient or authorized representative who has indicated his/her understanding and acceptance.     Dental Advisory Given  Plan Discussed with: CRNA and Anesthesiologist  Anesthesia Plan Comments:        Anesthesia Quick Evaluation

## 2021-06-15 NOTE — Op Note (Signed)
Patient was taken the operating suite and after induction of adequate regional anesthetic and IV sedation left upper extremity was prepped and draped usual sterile fashion.  An Esmarch was used to exsanguinate the limb and the tourniquet was inflated 250 mmHg.  This point time the left thumb was approached surgically with longitudinal traction, pronation, slight downward pressure of the metacarpal base to reduce a fracture at the thumb metacarpal base left side.  Reduction was confirmed fluoroscopically.  This point time 2.062 K wires were driven across fashion from distal to proximal from the thumb metacarpal base into the trapezium.  Multiple fluoroscopic imaging view showed reduction of the fracture and good placement hardware.  The K wires were cut outside the skin and bent upon themselves.  They were then then dressed Xeroform, 4 x 4's, and a radial gutter splint.  The patient tolerated this procedure well went recovery in stable fashion.

## 2021-06-15 NOTE — Transfer of Care (Signed)
Immediate Anesthesia Transfer of Care Note  Patient: Gary Calderon  Procedure(s) Performed: CLOSED REDUCTION METACARPAL WITH PERCUTANEOUS PINNING LEFT THUMB (Left: Finger)  Patient Location: PACU  Anesthesia Type:MAC and Regional  Level of Consciousness: awake, alert  and oriented  Airway & Oxygen Therapy: Patient Spontanous Breathing  Post-op Assessment: Report given to RN and Post -op Vital signs reviewed and stable  Post vital signs: Reviewed and stable  Last Vitals:  Vitals Value Taken Time  BP    Temp    Pulse    Resp    SpO2      Last Pain:  Vitals:   06/15/21 1242  TempSrc:   PainSc: 4       Patients Stated Pain Goal: 2 (14/83/07 3543)  Complications: No notable events documented.

## 2021-06-15 NOTE — Brief Op Note (Signed)
06/15/2021  3:32 PM  PATIENT:  Gary Calderon  31 y.o. adult  PRE-OPERATIVE DIAGNOSIS:  LEFT THUMB METACARPAL FRACTURE  POST-OPERATIVE DIAGNOSIS:  LEFT THUMB METACARPAL FRACTURE  PROCEDURE:  Procedure(s) with comments: CLOSED REDUCTION METACARPAL WITH PERCUTANEOUS PINNING LEFT THUMB (Left) - ANCILLARY BLOCK WITH MAC  SURGEON:  Surgeon(s) and Role:    Charlotte Crumb, MD - Primary  PHYSICIAN ASSISTANT:   ASSISTANTS: none   ANESTHESIA:   regional  EBL: None  BLOOD ADMINISTERED:none  DRAINS: none   LOCAL MEDICATIONS USED:  NONE  SPECIMEN:  No Specimen  DISPOSITION OF SPECIMEN:  N/A  COUNTS:  YES  TOURNIQUET:   Total Tourniquet Time Documented: Upper Arm (Left) - 12 minutes Total: Upper Arm (Left) - 12 minutes   DICTATION: .Dragon Dictation  PLAN OF CARE: Discharge to home after PACU  PATIENT DISPOSITION:  PACU - hemodynamically stable.   Delay start of Pharmacological VTE agent (>24hrs) due to surgical blood loss or risk of bleeding: yes

## 2021-06-15 NOTE — H&P (Signed)
KOFI PRINKEY is an 31 y.o. adult.   Chief Complaint: Left thumb pain HPI: 31 year old male status post trauma to left thumb with displaced thumb metacarpal base fracture left side  Past Medical History:  Diagnosis Date   Anxiety    Depression    Headache    HLD (hyperlipidemia)    tx fenofibrate   PONV (postoperative nausea and vomiting)    PTSD (post-traumatic stress disorder)    Thyroid disease     Past Surgical History:  Procedure Laterality Date   ABDOMINAL HYSTERECTOMY     MASTECTOMY      Family History  Family history unknown: Yes   Social History:  reports that he has never smoked. He has never used smokeless tobacco. He reports that he does not currently use alcohol. He reports current drug use. Drug: Marijuana.  Allergies:  Allergies  Allergen Reactions   Caffeine Other (See Comments)    Lymph nodes swell ups/fever/disorientation/soreness/sore throat   Mustard Seed Palpitations and Rash    Medications Prior to Admission  Medication Sig Dispense Refill   Ascorbic Acid (VITAMIN C PO) Take by mouth.     B Complex-C (B-COMPLEX WITH VITAMIN C) tablet Take 1 tablet by mouth daily.     cephALEXin (KEFLEX) 500 MG capsule Take 1 capsule (500 mg total) by mouth 3 (three) times daily. 30 capsule 0   fenofibrate (TRICOR) 48 MG tablet Take 96 mg by mouth in the morning.     sertraline (ZOLOFT) 100 MG tablet Take 200 mg by mouth every evening.     VITAMIN A PO Take 1 tablet by mouth in the morning.     Vitamin D, Ergocalciferol, (DRISDOL) 1.25 MG (50000 UNIT) CAPS capsule Take 50,000 Units by mouth every Sunday.      No results found for this or any previous visit (from the past 48 hour(s)). No results found.  Review of Systems  All other systems reviewed and are negative.  Blood pressure 119/76, pulse 62, temperature 98.2 F (36.8 C), temperature source Oral, resp. rate 11, height 5\' 9"  (1.753 m), weight 122.5 kg, SpO2 100 %. Physical Exam Constitutional:       Appearance: Normal appearance.  HENT:     Head: Normocephalic and atraumatic.  Eyes:     Pupils: Pupils are equal, round, and reactive to light.  Cardiovascular:     Rate and Rhythm: Normal rate.  Pulmonary:     Effort: Pulmonary effort is normal.  Musculoskeletal:     Left hand: Swelling, deformity and bony tenderness present.     Comments: Displaced left thumb metacarpal base fracture  Skin:    General: Skin is warm.  Neurological:     General: No focal deficit present.     Mental Status: He is alert and oriented to person, place, and time.  Psychiatric:        Mood and Affect: Mood normal.        Behavior: Behavior normal.        Thought Content: Thought content normal.        Judgment: Judgment normal.     Assessment/Plan 31 year old male with displaced thumb metacarpal base fracture left side.  Have discussed the role of operative intervention to include closed reduction and percutaneous pinning versus open treatment as necessary.  Patient understands risks and benefits and wishes to proceed.  The patient understands we cannot guarantee complete pain relief and may be need for further surgery in the future.  We will get this  done as an outpatient today.  Schuyler Amor, MD 06/15/2021, 2:41 PM

## 2021-06-15 NOTE — Progress Notes (Addendum)
Patient complaining of pain in his right foot wound (8/10), asking for pain medicine. Patient received Fentanyl IV per Dr. Tacy Dura verbal order.

## 2021-06-16 MED ORDER — OXYCODONE-ACETAMINOPHEN 5-325 MG PO TABS
1.0000 | ORAL_TABLET | Freq: Once | ORAL | Status: AC
  Administered 2021-06-16: 1 via ORAL
  Filled 2021-06-16: qty 1

## 2021-06-16 NOTE — Anesthesia Postprocedure Evaluation (Signed)
Anesthesia Post Note  Patient: Gary Calderon  Procedure(s) Performed: CLOSED REDUCTION METACARPAL WITH PERCUTANEOUS PINNING LEFT THUMB (Left: Finger)     Patient location during evaluation: PACU Anesthesia Type: Regional Level of consciousness: awake and alert Pain management: pain level controlled Vital Signs Assessment: post-procedure vital signs reviewed and stable Respiratory status: spontaneous breathing, nonlabored ventilation, respiratory function stable and patient connected to nasal cannula oxygen Cardiovascular status: blood pressure returned to baseline and stable Postop Assessment: no apparent nausea or vomiting Anesthetic complications: no   No notable events documented.  Last Vitals:  Vitals:   06/16/21 0401 06/16/21 0739  BP: (!) 105/53 92/64  Pulse: (!) 55 61  Resp: 19 18  Temp: 36.6 C 36.6 C  SpO2: 100% 100%    Last Pain:  Vitals:   06/16/21 0739  TempSrc: Oral  PainSc:                  [name]

## 2021-06-16 NOTE — Progress Notes (Signed)
°  Transition of Care [facility]) Screening Note   Patient Details  Name: Gary Calderon Date of Birth: 06/09/1990   Transition of Care [facility]) CM/SW Contact:    Bess Kinds, RN Phone Number: [phone] 06/16/2021, 8:00 AM    Transition of Care Department [facility]) has reviewed patient and no TOC needs have been identified at this time.

## 2021-06-17 ENCOUNTER — Encounter (HOSPITAL_COMMUNITY): Payer: Self-pay | Admitting: Orthopedic Surgery

## 2021-07-13 NOTE — Discharge Summary (Signed)
Admission diagnosis: Left thumb metacarpal fracture Discharge diagnosis: Same Hospital course: Patient was taken to the operating room for closed reduction and pinning of a metacarpal fracture on the left side.  Patient was admitted to the PACU in unfortunate was unable to secure a ride home.  He was admitted to the hospital overnight for social reasons and pain control.  He was discharged the next morning with follow-up in my office the following week. Admission date was 06/15/2021. Discharge date was 06/16/2021

## 2021-08-09 ENCOUNTER — Other Ambulatory Visit: Payer: Self-pay

## 2021-08-09 ENCOUNTER — Emergency Department (HOSPITAL_COMMUNITY)
Admission: EM | Admit: 2021-08-09 | Discharge: 2021-08-09 | Disposition: A | Discharge status: Home / Self Care | Payer: No Typology Code available for payment source | Attending: Emergency Medicine | Admitting: Emergency Medicine

## 2021-08-09 ENCOUNTER — Encounter (HOSPITAL_COMMUNITY): Payer: Self-pay | Admitting: Pharmacy Technician

## 2021-08-09 DIAGNOSIS — S99921D Unspecified injury of right foot, subsequent encounter: Secondary | ICD-10-CM | POA: Diagnosis present

## 2021-08-09 DIAGNOSIS — S060XAA Concussion with loss of consciousness status unknown, initial encounter: Secondary | ICD-10-CM | POA: Insufficient documentation

## 2021-08-09 DIAGNOSIS — S82831D Other fracture of upper and lower end of right fibula, subsequent encounter for closed fracture with routine healing: Secondary | ICD-10-CM | POA: Insufficient documentation

## 2021-08-09 DIAGNOSIS — M79671 Pain in right foot: Secondary | ICD-10-CM

## 2021-08-09 NOTE — Discharge Instructions (Addendum)
Follow-up up with neurology for the possible concussion.  Call and make an appointment.  Follow-up with with Dr. Burney Gauze as scheduled.  Make an appointment to follow-up with sports medicine information provided above.  Make appointment follow back up with endocrinology.  May be able to use urgent care her on campus to help you with the long-term thyroid issues. ?
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
## 2021-08-09 NOTE — ED Notes (Signed)
Pt states that they have been able to follow up with provider for his hand but has not had any other follow up. ? ?States that pain has been getting worse and that he has been having a lot of trouble the fatigue and brain fog the past few weeks.  ?Pt states he had CT scans. But thinks that more x-rays should have been done and would like more of a work up ?
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
## 2021-08-13 ENCOUNTER — Encounter: Payer: Self-pay | Admitting: Endocrinology

## 2021-08-13 ENCOUNTER — Ambulatory Visit: Payer: No Typology Code available for payment source | Admitting: Endocrinology

## 2021-08-13 ENCOUNTER — Other Ambulatory Visit: Payer: Self-pay

## 2021-08-13 DIAGNOSIS — R635 Abnormal weight gain: Secondary | ICD-10-CM

## 2021-08-13 MED ORDER — DEXAMETHASONE 1 MG PO TABS: 1.0000 mg | ORAL_TABLET | ORAL | 0 refills | Status: DC

## 2021-08-13 NOTE — Progress Notes (Signed)
? ?Subjective:  ? ? Patient ID: Gary Calderon, adult    DOB: 1990/07/24, 31 y.o.   MRN: [id number] ? ?HPI ?Pt is referred by Dr Quentin Cornwall, to consider the possibility of hypercortisolism.  Pt reports difficulty with concentration, weight gain, and HA.  he has not recently taken any steroids.  he has no h/o cancer, pituitary disorder, cataracts, PUD, HTN, amenorrhea, osteoporosis, DM, or adrenal disorder.  Pt says he thinks his mother had elev cortisol, but he is uncertain.  He has had TAH/BSO, and breast reduction.   ?Past Medical History:  ?Diagnosis Date  ? Anxiety   ? Depression   ? Headache   ? HLD (hyperlipidemia)   ? tx fenofibrate  ? PONV (postoperative nausea and vomiting)   ? PTSD (post-traumatic stress disorder)   ? Thyroid disease   ? ? ?Past Surgical History:  ?Procedure Laterality Date  ? ABDOMINAL HYSTERECTOMY    ? CLOSED REDUCTION METACARPAL WITH PERCUTANEOUS PINNING Left 06/15/2021  ? Procedure: CLOSED REDUCTION METACARPAL WITH PERCUTANEOUS PINNING LEFT THUMB;  Surgeon: Charlotte Crumb, MD;  Location: [place];  Service: Orthopedics;  Laterality: Left;  ANCILLARY BLOCK WITH MAC  ? MASTECTOMY    ? ? ?Social History  ? ?Socioeconomic History  ? Marital status: Unknown  ?  Spouse name: Not on file  ? Number of children: Not on file  ? Years of education: Not on file  ? Highest education level: Not on file  ?Occupational History  ? Not on file  ?Tobacco Use  ? Smoking status: Never  ? Smokeless tobacco: Never  ?Vaping Use  ? Vaping Use: Never used  ?Substance and Sexual Activity  ? Alcohol use: Not Currently  ? Drug use: Yes  ?  Types: Marijuana  ?  Comment: not frequently- last use 2022  ? Sexual activity: Not Currently  ?Other Topics Concern  ? Not on file  ?Social History Narrative  ? ** Merged History Encounter **  ?    ? ?Social Determinants of Health  ? ?Financial Resource Strain: Not on file  ?Food Insecurity: Not on file  ?Transportation Needs: Not on file  ?Physical Activity: Not on file   ?Stress: Not on file  ?Social Connections: Not on file  ?Intimate Partner Violence: Not on file  ? ? ?Current Outpatient Medications on File Prior to Visit  ?Medication Sig Dispense Refill  ? Ascorbic Acid (VITAMIN C PO) Take by mouth.    ? B Complex-C (B-COMPLEX WITH VITAMIN C) tablet Take 1 tablet by mouth daily.    ? cephALEXin (KEFLEX) 500 MG capsule Take 1 capsule (500 mg total) by mouth 3 (three) times daily. 30 capsule 0  ? fenofibrate (TRICOR) 48 MG tablet Take 96 mg by mouth in the morning.    ? oxyCODONE-acetaminophen (PERCOCET) 5-325 MG tablet Take 1 tablet by mouth every 4 (four) hours as needed for severe pain. 20 tablet 0  ? sertraline (ZOLOFT) 100 MG tablet Take 200 mg by mouth every evening.    ? VITAMIN A PO Take 1 tablet by mouth in the morning.    ? Vitamin D, Ergocalciferol, (DRISDOL) 1.25 MG (50000 UNIT) CAPS capsule Take 50,000 Units by mouth every Sunday.    ? ?No current facility-administered medications on file prior to visit.  ? ? ?Allergies  ?Allergen Reactions  ? Caffeine Other (See Comments)  ?  Lymph nodes swell ups/fever/disorientation/soreness/sore throat  ? Mustard Seed Palpitations and Rash  ? ? ?Family History  ?Family history unknown: Yes  ? ? ?  BP 126/88 (BP Location: Left Arm, Patient Position: Sitting, Cuff Size: Normal)   Pulse 90   Ht _0  (1.753 m)   Wt 271 lb 6.4 oz (123.1 kg)   SpO2 98%   BMI 40.08 kg/m?  ? ? ? ?Review of Systems ? ?   ?Objective:  ? Physical Exam ?VS: see vs page ?GEN: no distress ?HEAD: head: no deformity ?eyes: no periorbital swelling, no proptosis ?external nose and ears are normal ?NECK: supple, thyroid is not enlarged ?CHEST WALL: no deformity ?LUNGS: clear to auscultation ?CV: reg rate and rhythm, no murmur.  ?MUSCULOSKELETAL: gait is normal and steady ?EXTEMITIES: no deformity.  no leg edema ?NEURO:  readily moves all 4's.  ?SKIN:  Normal texture and temperature.  No rash or suspicious lesion is visible.  No striae.   ?NODES:  None palpable  at the neck.   ?PSYCH: alert, well-oriented.  Does not appear anxious nor depressed.   ? ?outside test results are reviewed (from 2022).   ?TSH=2.4 ?Testosterone=13  ? ?CT (2023) normal adrenals.  No mention is made of the pituitary.   ? ?I have reviewed outside records, and summarized: ?Pt was noted to have weight gain, and referred here.  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  Pt reported h/o hypothyroidism, but he was euthyroid off rx.    ? ? ?   ?Assessment & Plan:  ?Weight gain, uncertain etiology and prognosis.   ? ?Patient Instructions  ?A 24HR urine collection is requested for you.  ?After you complete this, you should do a "dexamethasone suppression test."  For this, you would take dexamethasone 1 mg at 10 pm (I have sent a prescription to your pharmacy), then come in for a "cortisol" blood test the next morning before 9 am.  You do not need to be fasting for this test.  ? ? ?

## 2021-08-13 NOTE — Patient Instructions (Addendum)
A 24HR urine collection is requested for you.  ?After you complete this, you should do a "dexamethasone suppression test."  For this, you would take dexamethasone 1 mg at 10 pm (I have sent a prescription to your pharmacy), then come in for a "cortisol" blood test the next morning before 9 am.  You do not need to be fasting for this test.  ?

## 2021-08-14 ENCOUNTER — Encounter: Payer: Self-pay | Admitting: Endocrinology

## 2021-08-14 ENCOUNTER — Ambulatory Visit (INDEPENDENT_AMBULATORY_CARE_PROVIDER_SITE_OTHER): Payer: Self-pay | Admitting: Family Medicine

## 2021-08-14 ENCOUNTER — Other Ambulatory Visit (INDEPENDENT_AMBULATORY_CARE_PROVIDER_SITE_OTHER): Payer: Medicaid Other

## 2021-08-14 ENCOUNTER — Encounter: Payer: Self-pay | Admitting: Family Medicine

## 2021-08-14 VITALS — BP 100/70 | Ht 69.0 in | Wt 271.0 lb

## 2021-08-14 DIAGNOSIS — S161XXA Strain of muscle, fascia and tendon at neck level, initial encounter: Secondary | ICD-10-CM | POA: Insufficient documentation

## 2021-08-14 DIAGNOSIS — S060X0A Concussion without loss of consciousness, initial encounter: Secondary | ICD-10-CM

## 2021-08-14 DIAGNOSIS — R635 Abnormal weight gain: Secondary | ICD-10-CM

## 2021-08-14 DIAGNOSIS — M25471 Effusion, right ankle: Secondary | ICD-10-CM | POA: Insufficient documentation

## 2021-08-14 DIAGNOSIS — M958 Other specified acquired deformities of musculoskeletal system: Secondary | ICD-10-CM

## 2021-08-14 LAB — CORTISOL: Cortisol, Plasma: 4.9 ug/dL

## 2021-08-14 LAB — T4, FREE: Free T4: 0.67 ng/dL (ref 0.60–1.60)

## 2021-08-14 LAB — TSH: TSH: 2.28 u[IU]/mL (ref 0.35–5.50)

## 2021-08-14 NOTE — Patient Instructions (Signed)
Nice to meet you ?Please try the boot  ?Please try physical therapy  ?Please call [phone] to schedule the MRI   ?Please send me a message in MyChart with any questions or updates.  ?We'll setup a virtual visit once the MRI is resulted.  ? ?--Dr. Jordan Likes ? ?

## 2021-08-14 NOTE — Progress Notes (Unsigned)
Total volume 3,500.  Started 08-13-2021 at 10:30 am ended 08-14-2021 at 1:00 pm. Per Dr Everardo All he still wants the lab to process this 24 hour urine. ?
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
## 2021-08-15 ENCOUNTER — Encounter: Payer: Self-pay | Admitting: Endocrinology

## 2021-08-15 ENCOUNTER — Telehealth: Payer: Self-pay | Admitting: Family Medicine

## 2021-08-15 DIAGNOSIS — S0990XA Unspecified injury of head, initial encounter: Secondary | ICD-10-CM
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
## 2021-08-16 NOTE — Telephone Encounter (Signed)
Will have neurology determine if MRI of head is warranted. Will plan ordering MRI ankle.  ? ?Myra Rude, MD ?[facility] ?08/16/2021, 8:42 AM ? ?

## 2021-08-17 ENCOUNTER — Telehealth: Payer: Self-pay

## 2021-08-17 NOTE — Telephone Encounter (Signed)
Attempted to contact the patient to inform him that we have not yet receive the results for his 24hour urine but we will inform him ones results are in and provider has reviewed. ?

## 2021-08-17 NOTE — Telephone Encounter (Signed)
Patient called in wanting to know the results of 24 hour urine and if it was accepted or if he needs to redo it  ? ? ?Please call and advise  ?

## 2021-08-19 LAB — ACTH: C206 ACTH: 13 pg/mL (ref 6–50)

## 2021-08-21 LAB — CORTISOL, URINE, 24 HOUR
24 Hour urine volume (VMAHVA): 3500 mL
CREATININE, URINE: 1.77 g/(24.h) (ref 0.50–2.15)
Cortisol (Ur), Free: 27.7 mcg/24 h (ref 4.0–50.0)

## 2021-08-25 ENCOUNTER — Other Ambulatory Visit: Payer: Medicaid Other

## 2021-08-27 ENCOUNTER — Encounter: Payer: Self-pay | Admitting: Neurology

## 2021-08-27 ENCOUNTER — Ambulatory Visit (INDEPENDENT_AMBULATORY_CARE_PROVIDER_SITE_OTHER): Payer: No Typology Code available for payment source | Admitting: Neurology

## 2021-08-27 VITALS — BP 115/75 | HR 69 | Ht 69.0 in | Wt 271.5 lb

## 2021-08-27 DIAGNOSIS — F0781 Postconcussional syndrome: Secondary | ICD-10-CM

## 2021-08-27 DIAGNOSIS — S060X0D Concussion without loss of consciousness, subsequent encounter: Secondary | ICD-10-CM

## 2021-08-27 DIAGNOSIS — G44209 Tension-type headache, unspecified, not intractable: Secondary | ICD-10-CM

## 2021-08-27 MED ORDER — ACETAMINOPHEN ER 650 MG PO TBCR: 650.0000 mg | EXTENDED_RELEASE_TABLET | Freq: Three times a day (TID) | ORAL | 0 refills | Status: AC | PRN

## 2021-08-27 MED ORDER — AMITRIPTYLINE HCL 10 MG PO TABS: 10.0000 mg | ORAL_TABLET | Freq: Every day | ORAL | 0 refills | Status: AC

## 2021-08-27 NOTE — Patient Instructions (Signed)
Start with amitriptyline 10 mg nightly for headache prevention ?Continue with ibuprofen and Tylenol as needed for headaches ?We will obtain MRI brain without contrast for further study ?Continue to follow-up with your primary care doctor and return in 6 months for follow-up ?
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
## 2021-09-03 ENCOUNTER — Telehealth: Payer: Self-pay | Admitting: Neurology

## 2021-09-03 NOTE — Telephone Encounter (Signed)
Patient is scheduled at GI for 09/05/21.  ?

## 2021-09-05 ENCOUNTER — Inpatient Hospital Stay: Admission: RE | Admit: 2021-09-05 | Payer: Self-pay | Source: Ambulatory Visit

## 2021-09-05 ENCOUNTER — Other Ambulatory Visit: Payer: Self-pay

## 2021-09-06 ENCOUNTER — Ambulatory Visit: Payer: Self-pay | Attending: Family Medicine

## 2021-09-06 ENCOUNTER — Telehealth: Payer: Self-pay

## 2021-09-06 DIAGNOSIS — R29898 Other symptoms and signs involving the musculoskeletal system: Secondary | ICD-10-CM

## 2021-09-06 DIAGNOSIS — S060X0A Concussion without loss of consciousness, initial encounter: Secondary | ICD-10-CM | POA: Insufficient documentation

## 2021-09-06 DIAGNOSIS — M542 Cervicalgia: Secondary | ICD-10-CM | POA: Insufficient documentation

## 2021-09-06 DIAGNOSIS — S161XXA Strain of muscle, fascia and tendon at neck level, initial encounter: Secondary | ICD-10-CM | POA: Insufficient documentation

## 2021-09-06 NOTE — Telephone Encounter (Signed)
Placed referral for Occupational Therapy evaluation for Hand Weakness and ROM Deficits. As well as Speech Therapy Evaluation for Brain Fog and Memory Impairments post concussion ? ?Rosemarie Ax, MD ?[facility] ?09/06/2021, 9:32 AM ? ?

## 2021-09-06 NOTE — Telephone Encounter (Addendum)
Dr. Jordan Likes, ? ?Gary Calderon was evaluated by Physical Therapy on 09/06/2021.  The patient would benefit from Occupational Therapy evaluation for Hand Weakness and ROM Deficits. As well as Speech Therapy Evaluation for Brain Fog and Memory Impairments post concussion.    ?If you agree, please place an order in [facility] workque in [facility] or fax the order to [phone]. ?Thank you, ?Adelfa Koh, PT, DPT ? ?[facility] ?[address] ?[address] ?[place], [place]  [address] ?Phone:  [phone] ?Fax:  [phone] ? ?
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
## 2021-09-09 ENCOUNTER — Ambulatory Visit
Admission: RE | Admit: 2021-09-09 | Discharge: 2021-09-09 | Disposition: A | Discharge status: Home / Self Care | Payer: Self-pay | Source: Ambulatory Visit | Attending: Family Medicine | Admitting: Family Medicine

## 2021-09-09 DIAGNOSIS — M958 Other specified acquired deformities of musculoskeletal system: Secondary | ICD-10-CM

## 2021-09-10 ENCOUNTER — Telehealth: Payer: Self-pay | Admitting: Family Medicine

## 2021-09-10 NOTE — Telephone Encounter (Signed)
Attempt to contact pt for Virtual appt scheduling for provider review of MRI results-- No answer, message left for pt to call office. ?--glh ?

## 2021-09-11 ENCOUNTER — Other Ambulatory Visit: Payer: Self-pay

## 2021-09-11 ENCOUNTER — Ambulatory Visit
Admission: RE | Admit: 2021-09-11 | Discharge: 2021-09-11 | Disposition: A | Discharge status: Home / Self Care | Payer: Self-pay | Source: Ambulatory Visit | Attending: Neurology | Admitting: Neurology

## 2021-09-11 DIAGNOSIS — S060X0D Concussion without loss of consciousness, subsequent encounter: Secondary | ICD-10-CM

## 2021-09-11 DIAGNOSIS — F0781 Postconcussional syndrome: Secondary | ICD-10-CM

## 2021-09-11 DIAGNOSIS — G44209 Tension-type headache, unspecified, not intractable: Secondary | ICD-10-CM

## 2021-09-13 ENCOUNTER — Telehealth (INDEPENDENT_AMBULATORY_CARE_PROVIDER_SITE_OTHER): Payer: Self-pay | Admitting: Family Medicine

## 2021-09-13 DIAGNOSIS — M25471 Effusion, right ankle: Secondary | ICD-10-CM
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
## 2021-09-17 ENCOUNTER — Encounter: Payer: Self-pay | Admitting: Occupational Therapy

## 2021-09-17 ENCOUNTER — Ambulatory Visit: Payer: Commercial Managed Care - HMO | Attending: Family Medicine | Admitting: Occupational Therapy

## 2021-09-17 DIAGNOSIS — M25642 Stiffness of left hand, not elsewhere classified: Secondary | ICD-10-CM | POA: Insufficient documentation

## 2021-09-17 DIAGNOSIS — M6281 Muscle weakness (generalized): Secondary | ICD-10-CM | POA: Insufficient documentation

## 2021-09-17 DIAGNOSIS — R278 Other lack of coordination: Secondary | ICD-10-CM | POA: Diagnosis present
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
## 2021-09-17 NOTE — Patient Instructions (Addendum)
Opposition (Active) ° ° °Touch tip of thumb to nail tip of each finger in turn, making an "O" shape. °Repeat __10__ times. Do _4-6___ sessions per day. ° ° °MP Flexion (Active) ° ° °Bend thumb to touch base of little finger, keeping tip joint straight. °Repeat __10-15__ times. Do _4-6___ sessions per day. ° ° ° ° ° ° °IP Flexion (Active Blocked) ° ° °Brace thumb below tip joint. Bend joint as far as possible. °Repeat __10__ times. Do _4-6___ sessions per day. ° ° °Composite Extension (Active) ° ° °Bring thumb up and out in hitchhiker position.  °Repeat __10-15__ times. Do _4-6___ sessions per day. ° °  °

## 2021-09-19 ENCOUNTER — Encounter: Payer: Self-pay | Admitting: Speech Pathology

## 2021-09-19 ENCOUNTER — Ambulatory Visit: Payer: Self-pay | Admitting: Rehabilitation

## 2021-09-21 ENCOUNTER — Ambulatory Visit: Payer: Commercial Managed Care - HMO

## 2021-09-21 ENCOUNTER — Ambulatory Visit: Payer: Commercial Managed Care - HMO | Admitting: Physical Therapy

## 2021-09-28 ENCOUNTER — Ambulatory Visit: Payer: Commercial Managed Care - HMO

## 2022-02-28 ENCOUNTER — Ambulatory Visit (INDEPENDENT_AMBULATORY_CARE_PROVIDER_SITE_OTHER): Payer: Commercial Managed Care - HMO | Admitting: Neurology

## 2022-02-28 ENCOUNTER — Encounter: Payer: Self-pay | Admitting: Neurology

## 2022-02-28 VITALS — BP 129/82 | HR 93 | Ht 69.0 in | Wt 270.0 lb

## 2022-02-28 DIAGNOSIS — G44209 Tension-type headache, unspecified, not intractable: Secondary | ICD-10-CM | POA: Diagnosis not present

## 2022-02-28 MED ORDER — TOPIRAMATE 25 MG PO TABS: 25.0000 mg | ORAL_TABLET | Freq: Every evening | ORAL | 0 refills | Status: AC

## 2022-02-28 NOTE — Patient Instructions (Addendum)
Start Topamax 25 mg, half tablet nightly for 1 week then increase to full tablet.   Please update [place] about the frequency of the headaches mainly if headaches are not improved, we will likely increase the Topamax Continue other medications Follow-up in 5-months
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
## 2022-05-07 DIAGNOSIS — Z0271 Encounter for disability determination: Secondary | ICD-10-CM

## 2022-05-09 ENCOUNTER — Ambulatory Visit: Payer: Commercial Managed Care - HMO | Admitting: Dietician

## 2022-09-02 ENCOUNTER — Encounter: Payer: Self-pay | Admitting: *Deleted

## 2022-09-05 ENCOUNTER — Ambulatory Visit: Payer: Commercial Managed Care - HMO | Admitting: Neurology

## 2022-10-02 ENCOUNTER — Other Ambulatory Visit: Payer: Self-pay | Admitting: Gastroenterology

## 2022-10-02 DIAGNOSIS — R748 Abnormal levels of other serum enzymes: Secondary | ICD-10-CM

## 2022-10-30 ENCOUNTER — Other Ambulatory Visit: Payer: Commercial Managed Care - HMO
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
## 2023-11-03 ENCOUNTER — Encounter (HOSPITAL_COMMUNITY): Payer: Self-pay

## 2023-11-03 ENCOUNTER — Emergency Department (HOSPITAL_COMMUNITY)

## 2023-11-03 ENCOUNTER — Other Ambulatory Visit: Payer: Self-pay

## 2023-11-03 ENCOUNTER — Emergency Department (HOSPITAL_COMMUNITY)
Admission: EM | Admit: 2023-11-03 | Discharge: 2023-11-03 | Disposition: A | Discharge status: Home / Self Care | Attending: Emergency Medicine | Admitting: Emergency Medicine

## 2023-11-03 DIAGNOSIS — D72829 Elevated white blood cell count, unspecified: Secondary | ICD-10-CM | POA: Diagnosis not present

## 2023-11-03 DIAGNOSIS — K5641 Fecal impaction: Secondary | ICD-10-CM | POA: Diagnosis not present

## 2023-11-03 DIAGNOSIS — K6289 Other specified diseases of anus and rectum: Secondary | ICD-10-CM | POA: Diagnosis present

## 2023-11-03 LAB — COMPREHENSIVE METABOLIC PANEL WITH GFR
ALT: 20 U/L (ref 0–44)
AST: 22 U/L (ref 15–41)
Albumin: 4 g/dL (ref 3.5–5.0)
Alkaline Phosphatase: 102 U/L (ref 38–126)
Anion gap: 8 (ref 5–15)
BUN: 28 mg/dL — ABNORMAL HIGH (ref 6–20)
CO2: 24 mmol/L (ref 22–32)
Calcium: 9.3 mg/dL (ref 8.9–10.3)
Chloride: 108 mmol/L (ref 98–111)
Creatinine, Ser: 0.92 mg/dL (ref 0.61–1.24)
GFR, Estimated: >60 mL/min (ref >60)
Glucose, Bld: 93 mg/dL (ref 70–99)
Potassium: 3.7 mmol/L (ref 3.5–5.1)
Sodium: 140 mmol/L (ref 135–145)
Total Bilirubin: 0.7 mg/dL (ref 0.0–1.2)
Total Protein: 6.5 g/dL (ref 6.5–8.1)

## 2023-11-03 LAB — CBC WITH DIFFERENTIAL/PLATELET
Abs Immature Granulocytes: 0.07 10*3/uL (ref 0.00–0.07)
Basophils Absolute: 0 10*3/uL (ref 0.0–0.1)
Basophils Relative: 0 %
Eosinophils Absolute: 0.1 10*3/uL (ref 0.0–0.5)
Eosinophils Relative: 1 %
HCT: 40.8 % (ref 39.0–52.0)
Hemoglobin: 13.2 g/dL (ref 13.0–17.0)
Immature Granulocytes: 1 %
Lymphocytes Relative: 20 %
Lymphs Abs: 2.7 10*3/uL (ref 0.7–4.0)
MCH: 29.5 pg (ref 26.0–34.0)
MCHC: 32.4 g/dL (ref 30.0–36.0)
MCV: 91.1 fL (ref 80.0–100.0)
Monocytes Absolute: 1.1 10*3/uL — ABNORMAL HIGH (ref 0.1–1.0)
Monocytes Relative: 8 %
Neutro Abs: 9.6 10*3/uL — ABNORMAL HIGH (ref 1.7–7.7)
Neutrophils Relative %: 70 %
Platelets: 215 10*3/uL (ref 150–400)
RBC: 4.48 MIL/uL (ref 4.22–5.81)
RDW: 12.7 % (ref 11.5–15.5)
WBC: 13.5 10*3/uL — ABNORMAL HIGH (ref 4.0–10.5)
nRBC: 0 % (ref 0.0–0.2)

## 2023-11-03 LAB — LIPASE, BLOOD: Lipase: 42 U/L (ref 11–51)

## 2023-11-03 MED ORDER — MORPHINE SULFATE (PF) 4 MG/ML IV SOLN
4.0000 mg | Freq: Once | INTRAVENOUS | Status: AC
  Administered 2023-11-03: 4 mg via INTRAVENOUS
  Filled 2023-11-03: qty 1

## 2023-11-03 MED ORDER — ONDANSETRON HCL 4 MG/2ML IJ SOLN
4.0000 mg | Freq: Once | INTRAMUSCULAR | Status: AC
  Administered 2023-11-03: 4 mg via INTRAVENOUS
  Filled 2023-11-03: qty 2

## 2023-11-03 MED ORDER — IOHEXOL 300 MG/ML  SOLN
100.0000 mL | Freq: Once | INTRAMUSCULAR | Status: AC | PRN
  Administered 2023-11-03: 100 mL via INTRAVENOUS

## 2023-11-03 MED ORDER — BISACODYL 5 MG PO TBEC
5.0000 mg | DELAYED_RELEASE_TABLET | Freq: Once | ORAL | Status: AC
  Administered 2023-11-03: 5 mg via ORAL
  Filled 2023-11-03: qty 1

## 2023-11-03 MED ORDER — GOLYTELY 236 G PO SOLR
4.0000 L | Freq: Once | ORAL | 0 refills | Status: AC
Start: 2023-11-03 — End: 2023-11-03

## 2023-11-03 NOTE — ED Provider Notes (Addendum)
 [place] EMERGENCY DEPARTMENT AT [facility] Provider Note   CSN: [id number] Arrival date & time: 11/03/23  1308     Patient presents with: Abdominal Pain   Gary Calderon is a 33 y.o. adult.   The history is provided by the patient.  Abdominal Pain He has history of hyperlipidemia comes in complaining of not having a bowel movement and rectal pain for the last 3 days.  Pain also goes to the right lower abdomen.  There has been some nausea but no vomiting.  He states he has not been passing any flatus.  Appetite has been decreased.  He has tried taking a variety of herbs to try to have a bowel movement, but without success.   Prior to Admission medications   Medication Sig Start Date End Date Taking? Authorizing Provider  acetaminophen  (TYLENOL  8 HOUR) 650 MG CR tablet Take 1 tablet (650 mg total) by mouth every 8 (eight) hours as needed for pain. 08/27/21   Camara, Amadou, MD  amitriptyline  (ELAVIL ) 10 MG tablet Take 1 tablet (10 mg total) by mouth at bedtime. 08/27/21 02/28/22  Camara, Amadou, MD  Ascorbic Acid (VITAMIN C PO) Take by mouth.    [provider]  B Complex-C (B-COMPLEX WITH VITAMIN C) tablet Take 1 tablet by mouth daily.    [provider]  cephALEXin  (KEFLEX ) 500 MG capsule Take 1 capsule (500 mg total) by mouth 3 (three) times daily. 05/29/21   Alissa April, MD  fenofibrate (TRICOR) 48 MG tablet Take 96 mg by mouth in the morning. 04/29/21   [provider]  topiramate  (TOPAMAX ) 25 MG tablet Take 1 tablet (25 mg total) by mouth at bedtime. 02/28/22 05/29/22  Camara, Amadou, MD  VITAMIN A PO Take 1 tablet by mouth in the morning.    [provider]  Vitamin D, Ergocalciferol, (DRISDOL) 1.25 MG (50000 UNIT) CAPS capsule Take 50,000 Units by mouth every Sunday. 01/21/21   [provider]    Allergies: Caffeine and Mustard    Review of Systems  Gastrointestinal:  Positive for abdominal pain.  All other systems  reviewed and are negative.   Updated Vital Signs BP 132/82   Pulse 76   Temp 97.8 F (36.6 C) (Oral)   SpO2 100%   Physical Exam Vitals and nursing note reviewed.   33 year old male, resting comfortably and in no acute distress. Vital signs are normal. Oxygen saturation is 100%, which is normal. Head is normocephalic and atraumatic. PERRLA, EOMI.  Lungs are clear without rales, wheezes, or rhonchi. Chest is nontender. Heart has regular rate and rhythm without murmur. Abdomen is soft, flat, with right lower quadrant tenderness which is quite well localized.  There is no rebound or guarding. Extremities have no cyanosis or edema, full range of motion is present. Skin is warm and dry without rash. Neurologic: Awake and alert, moves all extremities equally.  (all labs ordered are listed, but only abnormal results are displayed) Labs Reviewed  COMPREHENSIVE METABOLIC PANEL WITH GFR - Abnormal; Notable for the following components:      Result Value   BUN 28 (*)    All other components within normal limits  CBC WITH DIFFERENTIAL/PLATELET - Abnormal; Notable for the following components:   WBC 13.5 (*)    Neutro Abs 9.6 (*)    Monocytes Absolute 1.1 (*)    All other components within normal limits  LIPASE, BLOOD  URINALYSIS, ROUTINE W REFLEX MICROSCOPIC   Radiology: CT ABDOMEN  PELVIS W CONTRAST Result Date: 11/03/2023 CLINICAL DATA:  Right lower quadrant abdominal pain. EXAM: CT ABDOMEN AND PELVIS WITH CONTRAST TECHNIQUE: Multidetector CT imaging of the abdomen and pelvis was performed using the standard protocol following bolus administration of intravenous contrast. RADIATION DOSE REDUCTION: This exam was performed according to the departmental dose-optimization program which includes automated exposure control, adjustment of the mA and/or kV according to patient size and/or use of iterative reconstruction technique. CONTRAST:  OMNIPAQUE  IOHEXOL  300 MG/ML  SOLN COMPARISON:   05/24/2021 FINDINGS: Lower chest: No acute findings. Hepatobiliary: Small area of low attenuation in the anterior liver, adjacent to the falciform ligament, is in a characteristic location for focal fatty deposition. There is no evidence for gallstones, gallbladder wall thickening, or pericholecystic fluid. No intrahepatic or extrahepatic biliary dilation. Pancreas: No focal mass lesion. No dilatation of the main duct. No intraparenchymal cyst. No peripancreatic edema. Spleen: No splenomegaly. No suspicious focal mass lesion. Adrenals/Urinary Tract: No adrenal nodule or mass. Kidneys unremarkable. No evidence for hydroureter. The urinary bladder appears normal for the degree of distention. Stomach/Bowel: Stomach is unremarkable. No gastric wall thickening. No evidence of outlet obstruction. Duodenum is normally positioned as is the ligament of Treitz. No small bowel wall thickening. No small bowel dilatation. The terminal ileum is normal. The appendix is normal. No gross colonic mass. No colonic wall thickening. Small to moderate volume stool in the right and transverse colon. Left colon is essentially free of stool with large stool volume noted in the rectum. Subtle perirectal edema/inflammation evident. Vascular/Lymphatic: No abdominal aortic aneurysm. No abdominal aortic atherosclerotic calcification. There is no gastrohepatic or hepatoduodenal ligament lymphadenopathy. No retroperitoneal or mesenteric lymphadenopathy. No pelvic sidewall lymphadenopathy. Reproductive: The prostate gland and seminal vesicles are unremarkable. Other: No intraperitoneal free fluid. Musculoskeletal: No worrisome lytic or sclerotic osseous abnormality. IMPRESSION: 1. Large stool volume in the rectum with subtle perirectal edema/inflammation. Imaging features compatible with fecal impaction. Mild stool volume right colon with descending colon largely free of stool. 2. Otherwise no acute findings in the abdomen or pelvis. Electronically  Signed   By: Donnal Fusi M.D.   On: 11/03/2023 05:22     Procedures   Medications Ordered in the ED  bisacodyl (DULCOLAX) EC tablet 5 mg (has no administration in time range)  ondansetron  (ZOFRAN ) injection 4 mg (4 mg Intravenous Given 11/03/23 0345)  morphine (PF) 4 MG/ML injection 4 mg (4 mg Intravenous Given 11/03/23 0345)  iohexol  (OMNIPAQUE ) 300 MG/ML solution 100 mL (100 mLs Intravenous Contrast Given 11/03/23 0500)                                    Medical Decision Making Amount and/or Complexity of Data Reviewed Labs: ordered. Radiology: ordered.  Risk OTC drugs. Prescription drug management.   Right lower quadrant pain with constipation.  This is a presentation with wide range of treatment options and entails a high risk of morbidity and complications.  Differential diagnosis includes, but is not limited to, appendicitis, diverticulitis, urolithiasis, pyelonephritis, stercoral colitis.  I have ordered morphine for pain, ondansetron  for nausea, laboratory workup and CT of abdomen and pelvis.  I have reviewed his past records, and can find no relevant past visits, no prior abdominal imaging.  I have reviewed his laboratory test, my interpretation is elevated BUN with normal creatinine which may indicate some degree of dehydration and otherwise normal comprehensive metabolic panel, normal lipase, mild leukocytosis  which is nonspecific.  CT of abdomen and pelvis shows fecal impaction but no other acute process.  I have independently viewed the images, and agree with the radiologist's interpretation.  I have attempted manual disimpaction, but the stool is high enough up that I cannot manipulate it with my finger to actually disimpact him.  I have ordered a dose of bisacodyl and I am discharging him with a prescription for polyethylene glycol and I have given him instructions on how to maintain a good bowel regimen.  Return precautions discussed.  Final diagnoses:  Fecal impaction  [facility])    ED Discharge Orders          Ordered    polyethylene glycol (GOLYTELY) 236 g solution   Once        11/03/23 0732               Alissa April, MD 11/03/23 2951    Alissa April, MD 11/03/23 [phone]

## 2023-11-03 NOTE — ED Triage Notes (Addendum)
 Pt came in via EMS from home w/ c/o of RLQ abdominal pain. Had pain before and it was constipation. Hx of hemorrhoids. Last BM was 3 days ago. Tried OTC medications with no relief. Denies N/V. Endorses dizziness

## 2023-11-03 NOTE — Discharge Instructions (Addendum)
 Once you have cleared the stool that is in your rectum, you will need to start a good bowel regimen to keep yourself from getting constipated like this.  You need to increase the amount of fiber in your diet and, if necessary, use fiber supplements.  In addition, you can take polyethylene glycol (MiraLAX) as needed to keep your bowels moving.  MiraLAX is safe to take every day and can even be increased to 2 or 3 times a day if needed.  At any point, if you go more than 2 days without a bowel movement, take a laxative such as bisacodyl (Dulcolax).
